# Patient Record
Sex: Female | Born: 1970 | Race: White | Hispanic: No | Marital: Married | Smoking: Never smoker
Health system: Southern US, Community
[De-identification: ages and names within clinical notes are randomized; demographics above are authoritative.]

## PROBLEM LIST (undated history)

## (undated) ENCOUNTER — Emergency Department (HOSPITAL_COMMUNITY): Admission: EM | Payer: Self-pay | Source: Home / Self Care

## (undated) DIAGNOSIS — N393 Stress incontinence (female) (male): Secondary | ICD-10-CM

## (undated) DIAGNOSIS — N816 Rectocele: Secondary | ICD-10-CM

## (undated) HISTORY — DX: Stress incontinence (female) (male): N39.3

## (undated) HISTORY — DX: Rectocele: N81.6

---

## 1990-08-05 HISTORY — PX: CHOLECYSTECTOMY: SHX55

## 2002-08-05 HISTORY — PX: TUBAL LIGATION: SHX77

## 2003-08-26 DIAGNOSIS — R635 Abnormal weight gain: Secondary | ICD-10-CM | POA: Insufficient documentation

## 2003-08-26 DIAGNOSIS — E669 Obesity, unspecified: Secondary | ICD-10-CM | POA: Insufficient documentation

## 2003-09-30 DIAGNOSIS — I4510 Unspecified right bundle-branch block: Secondary | ICD-10-CM | POA: Insufficient documentation

## 2003-09-30 DIAGNOSIS — R002 Palpitations: Secondary | ICD-10-CM | POA: Insufficient documentation

## 2006-09-17 ENCOUNTER — Ambulatory Visit (HOSPITAL_COMMUNITY): Admission: RE | Admit: 2006-09-17 | Discharge: 2006-09-17 | Payer: Self-pay | Admitting: Obstetrics & Gynecology

## 2008-06-23 ENCOUNTER — Other Ambulatory Visit: Admission: RE | Admit: 2008-06-23 | Discharge: 2008-06-23 | Payer: Self-pay | Admitting: Obstetrics and Gynecology

## 2008-08-05 HISTORY — PX: ABDOMINAL HYSTERECTOMY: SHX81

## 2008-09-27 ENCOUNTER — Ambulatory Visit (HOSPITAL_COMMUNITY): Admission: RE | Admit: 2008-09-27 | Discharge: 2008-09-27 | Payer: Self-pay | Admitting: Obstetrics and Gynecology

## 2009-03-14 ENCOUNTER — Ambulatory Visit (HOSPITAL_COMMUNITY): Admission: RE | Admit: 2009-03-14 | Discharge: 2009-03-15 | Payer: Self-pay | Admitting: Obstetrics and Gynecology

## 2009-03-14 ENCOUNTER — Encounter: Payer: Self-pay | Admitting: Obstetrics and Gynecology

## 2010-11-10 LAB — CBC
HCT: 36.8 % (ref 36.0–46.0)
Hemoglobin: 10.1 g/dL — ABNORMAL LOW (ref 12.0–15.0)
MCHC: 34.9 g/dL (ref 30.0–36.0)
MCV: 92.6 fL (ref 78.0–100.0)
Platelets: 272 10*3/uL (ref 150–400)
RBC: 3.98 MIL/uL (ref 3.87–5.11)
RDW: 14.5 % (ref 11.5–15.5)
WBC: 9.5 10*3/uL (ref 4.0–10.5)

## 2010-11-10 LAB — DIFFERENTIAL
Basophils Absolute: 0 10*3/uL (ref 0.0–0.1)
Basophils Relative: 0 % (ref 0–1)
Eosinophils Absolute: 0 10*3/uL (ref 0.0–0.7)
Lymphocytes Relative: 16 % (ref 12–46)
Monocytes Absolute: 0.6 10*3/uL (ref 0.1–1.0)
Neutrophils Relative %: 78 % — ABNORMAL HIGH (ref 43–77)

## 2010-11-10 LAB — COMPREHENSIVE METABOLIC PANEL
Albumin: 3.8 g/dL (ref 3.5–5.2)
CO2: 24 mEq/L (ref 19–32)
Chloride: 105 mEq/L (ref 96–112)
Sodium: 135 mEq/L (ref 135–145)
Total Bilirubin: 0.5 mg/dL (ref 0.3–1.2)
Total Protein: 7.4 g/dL (ref 6.0–8.3)

## 2010-11-10 LAB — TYPE AND SCREEN: Antibody Screen: NEGATIVE

## 2010-12-18 NOTE — Discharge Summary (Signed)
Christy Benton, REIMANN               ACCOUNT NO.:  [id number]   MEDICAL RECORD NO.:  [id number]          PATIENT TYPE:  OIB   LOCATION:  A304                          FACILITY:  [facility]   PHYSICIAN:  Tilda Burrow, M.D. DATE OF BIRTH:  [birth year]   DATE OF ADMISSION:  03/14/2009  DATE OF DISCHARGE:  08/11/2010LH                               DISCHARGE SUMMARY   ADMITTING DIAGNOSES:  Cervical dysplasia CIN III, severe dysmenorrhea.   DISCHARGE DIAGNOSES:  Cervical dysplasia CIN III, severe dysmenorrhea,  pathology report pending, bilateral functional ovarian cyst.   PROCEDURE:  Laparoscopically-assisted vaginal hysterectomy   DISCHARGE MEDICATIONS:  1. Vicodin 5/500, 30 tablets 1-2 q.6 h. p.r.n. pain.  2. Cipro 500 mg p.o. b.i.d. x5 days.  3. Laxative of choice 1 p.o. daily.  4. Citalopram 10 mg p.o. daily.  5. Ambien 10 mg p.o. nightly p.r.n. sleep.   HOSPITAL SUMMARY:  This 40 year old gravida 4, para 4, status post C-  section x1, vaginal delivery x3, was admitted for LAVH for CIN III of  the cervix plus severe dysmenorrhea.  The patient declined consideration  of conization and endometrial ablation.  See HPI for details.   HOSPITAL COURSE:  The patient was admitted with a hemoglobin 12.9,  hematocrit 36.8, blood type O negative with laparoscopically-assisted  vaginal hysterectomy performed with EBL 100-200 mL.  Postoperatively,  the patient did excellently, had a slight tachycardia during the first  12 hours of post procedure, was ambulatory, tolerating regular diet, by  the end of the day with minimal pain, med requirements, last IV  analgesics was the evening of surgery.  She was ambulatory with  postoperative hemoglobin 10 and hematocrit 31.  On postop day #1,  tolerating regular diet, minimally uncomfortable with bowel sounds  active and considered stable for discharge with for followup.  Due to  the unexpected change in hemoglobin, we will treat prophylactically with  oral antibiotics x5 days.  In case there was some postoperative anterior  abdominal oozing, the patient is to notify office for abdominal pain,  nausea and vomiting, temperature greater than 100.6 or worsening of  overall condition.     Tilda Burrow, M.D.  Electronically Signed    JVF/MEDQ  D:  03/15/2009  T:  03/15/2009  Job:  [id number]   cc:   [facility]

## 2010-12-18 NOTE — H&P (Signed)
NAME:  Christy Benton, Christy Benton               ACCOUNT NO.:  [id number]   MEDICAL RECORD NO.:  [id number]         PATIENT TYPE:  PAMB   LOCATION:  DAY                           FACILITY:  [facility]   PHYSICIAN:  Tilda Burrow, M.D. DATE OF BIRTH:  09/25/1970   DATE OF ADMISSION:  DATE OF DISCHARGE:  LH                              HISTORY & PHYSICAL   ADMISSION DIAGNOSIS:  1. Cervical dysplasia, cervical intraepithelial neoplasia 3.  2. Severe dysmenorrhea.   HISTORY OF PRESENT ILLNESS:  This is a 40 year old female, gravida 4,  para 4, vaginal delivery x3 with cesarean section x1, is admitted at  this time for laparoscopically-assisted vaginal hysterectomy.  Christy Benton  has been seen in the office for evaluation of abnormal Pap smear showing  high-grade abnormality, originally noted in November 2009 with CIN 3  versus carcinoma in situ.  Colposcopic biopsy showed persistent high-  grade dysplasia.  Treatment options were discussed including the option  of a cold knife conization.  Ultrasound was obtained which shows the  upper limits of normal sized uterus, 8.7 x 4.1 x 6.2 cm with  heterogeneous myometrial tissues.  No discrete masses with a normal  secretory endometrium complex which was biopsied and proven to be  benign.  The patient complains of periods every 3 weeks lasting 3-6 days  with day 1 being okay but day 2 being described as the absolute worse  heavy with clots and moderate cramps.  She is exhausted with periods for  several days.  She denies dyspareunia and pelvic ache but premenstrual  backache x3 days is debilitating.  We discussed the option of  endometrial ablation and have discussed the option of ablation versus  hysterectomy.  The patient declines ablation even after thorough review  of options and desires hysterectomy as the treatment of preference.  She  has had the procedure reviewed using Krames hysterectomy booklets and  specific booklets reviewing laparoscopically-assisted  vaginal  hysterectomy.  Procedures have been reviewed in detail.  Questions  answered.  The patient has been scheduled for hysterectomy,  laparoscopically-assisted vaginal approach, on March 14, 2009.  The  patient has a small posterior vaginal wall relaxation, but does not  desire addressing this at this time.  She desires ovarian preservation  unless specific abnormalities are found at the time of surgery, at which  time removal of an ovary if considered medically indicated would be  appropriate.   Review of systems include slight stress incontinence which has the whole  bladder but otherwise no difficulty.  Bowel movements are not a  significant problem at this time.   PAST MEDICAL HISTORY:  Benign with no specific medical complaints at  this time.   SURGICAL HISTORY:  Cholecystectomy in 1992, tubal ligation in 2004,  cesarean section in 1991 with 3 sets, vaginal birth after cesarean.   ALLERGIES:  None.   MEDICATIONS:  None present.   PHYSICAL EXAMINATION:  VITAL SIGNS:  Height 5 feet 4 inches, weight  239.4, blood pressure 110/72.  GENERAL:  Pupils equal, round, and reactive.  NECK:  Supple.  CHEST:  Clear to auscultation.  ABDOMEN:  Nontender.  Well-healed lower abdominal scar from cesarean  section.  EXTERNAL GENITALIA:  Multiparous female with moderate posterior wall  relaxation.  Cervix is multiparous.  Recent Pap smear abnormal with  biopsy-proven high-grade dysplasia.  Uterus is upper limits of normal  size, mobile, mid plane to slightly retroverted.  Adnexa is nontender  without masses and negative by recent ultrasound.   PLAN:  Laparoscopically-assisted vaginal hysterectomy with preservation  of ovaries on March 14, 2009.      Tilda Burrow, M.D.  Electronically Signed     JVF/MEDQ  D:  03/08/2009  T:  03/09/2009  Job:  [id number]   cc:   [facility].

## 2010-12-18 NOTE — Op Note (Signed)
NAME:  Christy Benton, Christy Benton               ACCOUNT NO.:  [id number]   MEDICAL RECORD NO.:  [id number]          PATIENT TYPE:  OIB   LOCATION:  A304                          FACILITY:  [facility]   PHYSICIAN:  Tilda Burrow, M.D. DATE OF BIRTH:  05/09/1971   DATE OF PROCEDURE:  03/14/2009  DATE OF DISCHARGE:                               OPERATIVE REPORT   PREOPERATIVE DIAGNOSES:  1. Cervical dysplasia, high grade.  2. Severe dysmenorrhea.   POSTOPERATIVE DIAGNOSES:  1. Cervical dysplasia, high grade.  2. Severe dysmenorrhea.  3. Bilateral simple ovarian cysts, right paratubal cyst 1 cm.   PROCEDURE:  Laparoscopically-assisted vaginal hysterectomy.   SURGEON:  Tilda Burrow, MD   ASSISTANT:  Cecile Sheerer, RN FA   ANESTHESIA:  Kathrynn Speed, CRNA   COMPLICATIONS:  None.   FINDINGS:  Diffuse pelvic relaxation, minimal bladder flap adhesions  status post C-section, small asymptomatic rectocele, not addressed at  this time, 2 small simple cysts in the ovaries, bilaterally.   INDICATIONS:  See HPI.   DETAILS OF PROCEDURE:  The patient was taken to the operating room,  prepped and draped in the usual fashion for combined abdominal and  vaginal procedure with Flowtron leg supports in place.  Time-out  conducted.  IV antibiotics preoperatively administered and standard  prepping and draping.  Foley catheter was in place.  The cervix was  grasped with single-tooth tenaculum, and uterine manipulator placed  inside the cervix with some difficulty.  The uterine endocervical canal  was quite stenotic and may have been a contributing factor to her  dysmenorrhea.   Once the manipulator was in place, we addressed the abdomen where an  infraumbilical vertical 1-cm skin incision was made as well as  transverse suprapubic and transverse right lower quadrant incision.  Veress needle was introduced through the umbilicus, pneumoperitoneum  achieved after water droplet test confirmed free flow  of fluid into the  abdominal cavity, the pneumoperitoneum achieved under 9 mmHg, and direct  insertion of the laparoscopic trocar performed without difficulty  identifying normal pelvis and no evidence of bleeding associated with  peritoneal entry.  Photo one shows the surface of the bowel upon entry.  Photo two shows the uterus, tubes, and ovaries once the patient was in  Trendelenburg position.   The right lower quadrant trocar was placed without difficulty and clear  space lateral to the inferior epigastric vessels.  The right upper  quadrant was noted to have some thin omental adhesions status post prior  gallbladder surgery.  These were considered asymptomatic and left alone.  Attention was directed to the suprapubic area and the suprapubic trocar  placed under direct visualization without difficulty.  The LigaSure 5-mm  coagulation cutting device was placed through the right lower quadrant  port, and the uterine grasping device placed through the suprapubic  port, and attention directed to the right adnexa where the elongate  right utero-ovarian ligament could be identified as well as a right  ovarian ligament.  A series of small bites using the LigaSure to clamp,  coagulate, and transect was used to mobilize the right  adnexal  structures.  This was performed halfway down the side of the uterus and  then LigaSure used to coagulate the uterine vessels going up the side of  the uterus.  On the left side, a similar process was used with easy  access to the pedicles on this side due to the mobility of the pelvis  structures.  Bladder flap was developed anteriorly by using laparoscopic  scissors to open the bladder flap, blunt dissection used to find the  vesicouterine space and elevate the bladder anteriorly and inferiorly.  The procedure was quite hemostatic and then we went below for completion  of the procedure after deflating the abdomen and removal of all  laparoscopic  instruments, leaving the laparoscopic sleeves in place with  no pressure on these sleeves.   Vaginal portion of the case was performed by placing a medium-length  weighted speculum in the posterior vagina, identifying the cervix,  grasping at the edges with Lahey thyroid tenaculum, circumscribing the  cervix from 9 o'clock to 3 o'clock after infiltrating the anterior  vesicouterine space along its inferior portions using Marcaine solution.  The vesicouterine reflection of vaginal epithelium was transected with  Bovie, elevated and bluntly dissected upward without difficulty, and the  free space identified without difficulty.  Bladder was retracted away.  Posterior colpotomy was performed easily identifying the cul-de-sac, the  uterosacral ligaments on each side were then clamped, cut, and suture  ligated and tagged using 0 chromic, Zeppelin tissue grasping device and  Mayo scissors for transection.  These pedicles were tagged for future  identification.  Lower cardinal ligaments were serially clamped, cut,  and suture ligated in small bites, and the upper cardinal ligaments  treated similarly.  When we reached the lower aspects of the broad  ligament, we were able to cross-clamp across the remaining pedicle  completely and transect the attachments, completely freeing up the  uterine specimen which could be removed.  These pedicles were ligated  with 0 chromic as well.  Pedicles were inspected, and it was quite  hemostatic except for a small bit of oozing from the posterior colpotomy  incision posterior edge of cuff.  The uterosacral ligaments were quite  lax with a potential for enterocele, so a suture of 2-0 Prolene was used  to grasp the medial aspects of the uterosacral pedicle on each side and  loosely attached them through the tissues of the posterior vaginal cuff,  approximately 5 to 8 mm above the edge of the colpotomy incision.  This  was placed in such a way to reduce lateral  retraction of the uterosacral  ligaments.  The bladder flap was then able to be brought down and  attached to the posterior aspects of the peritoneum behind the Prolene  suture and close the cuff.  Hemostasis had been quite good, and pedicles  had been inspected for hemostasis.   The cuff was then closed with a series of interrupted 0 chromic sutures  placed front in the sagittal plane, pulling cuff together nicely.  The 2-  0 Prolene had been tied down loosely and knot buried prior to the  vaginal cuff closure.  Hemostasis was excellent.  EBL 100 mL.  Sponge  and needle counts were correct.   Attention was then redirected to the abdomen where reinsufflation was  performed.  The pelvis inspected.  Photo three showing the cuff  immediately upon visualizing the pelvis, photo four showing a fuller  view of the pelvis.  The photo five  shows the right sidewall with the  ureter visible and clearly normal diameter and peristalsing without  difficulty.  Procedure was considered successful.  Abdomen was deflated.  Laparoscopic equipment having been removed and the umbilical site closed  with 0 Vicryl at the fascial level and 3-0 Vicryl at subcu level and  Steri-Strips placed.  EBL 100 mL.  Sponge and needle counts correct.      Tilda Burrow, M.D.  Electronically Signed     JVF/MEDQ  D:  03/14/2009  T:  03/14/2009  Job:  [id number]   cc:   [facility].   Wyvonnia Lora  Fax: [phone]

## 2015-05-30 ENCOUNTER — Telehealth: Payer: Self-pay | Admitting: Adult Health

## 2015-05-30 ENCOUNTER — Other Ambulatory Visit: Payer: Self-pay | Admitting: Adult Health

## 2015-05-30 NOTE — Telephone Encounter (Signed)
Number could not be completed

## 2015-05-31 ENCOUNTER — Other Ambulatory Visit: Payer: Self-pay | Admitting: Adult Health

## 2015-05-31 ENCOUNTER — Telehealth: Payer: Self-pay | Admitting: Adult Health

## 2015-05-31 DIAGNOSIS — IMO0002 Reserved for concepts with insufficient information to code with codable children: Secondary | ICD-10-CM

## 2015-05-31 DIAGNOSIS — R229 Localized swelling, mass and lump, unspecified: Principal | ICD-10-CM

## 2015-05-31 NOTE — Telephone Encounter (Signed)
Left message to call me back with details about mammogram

## 2015-05-31 NOTE — Telephone Encounter (Signed)
Pt had mammogram on mobile unit and they called and told her, she needs US on left breast, [facility] to [facility] and [facility] prior to US.

## 2015-06-07 ENCOUNTER — Ambulatory Visit (INDEPENDENT_AMBULATORY_CARE_PROVIDER_SITE_OTHER): Payer: BLUE CROSS/BLUE SHIELD | Admitting: Adult Health

## 2015-06-07 ENCOUNTER — Encounter: Payer: Self-pay | Admitting: Adult Health

## 2015-06-07 VITALS — BP 102/60 | HR 80 | Ht 64.5 in | Wt 218.5 lb

## 2015-06-07 DIAGNOSIS — Z1212 Encounter for screening for malignant neoplasm of rectum: Secondary | ICD-10-CM | POA: Diagnosis not present

## 2015-06-07 DIAGNOSIS — Z01419 Encounter for gynecological examination (general) (routine) without abnormal findings: Secondary | ICD-10-CM

## 2015-06-07 DIAGNOSIS — N816 Rectocele: Secondary | ICD-10-CM

## 2015-06-07 DIAGNOSIS — Z1389 Encounter for screening for other disorder: Secondary | ICD-10-CM

## 2015-06-07 DIAGNOSIS — N393 Stress incontinence (female) (male): Secondary | ICD-10-CM

## 2015-06-07 HISTORY — DX: Stress incontinence (female) (male): N39.3

## 2015-06-07 HISTORY — DX: Rectocele: N81.6

## 2015-06-07 LAB — POCT URINALYSIS DIPSTICK
GLUCOSE UA: NEGATIVE
Leukocytes, UA: NEGATIVE
NITRITE UA: NEGATIVE
Protein, UA: NEGATIVE
RBC UA: NEGATIVE

## 2015-06-07 LAB — HEMOCCULT GUIAC POC 1CARD (OFFICE): FECAL OCCULT BLD: NEGATIVE

## 2015-06-07 MED ORDER — OXYBUTYNIN CHLORIDE ER 10 MG PO TB24: 10.0000 mg | ORAL_TABLET | Freq: Every day | ORAL | Status: DC

## 2015-06-07 NOTE — Patient Instructions (Signed)
Labs Tuesday 11/8 Get US 11/8 Physical in  1 year Mammogram yearly Colonoscopy at 48

## 2015-06-07 NOTE — Progress Notes (Signed)
Patient ID: Christy Benton, female   DOB: 1970-12-14, 44 y.o.   MRN: [id number] History of Present Illness: Christy Benton is a 44 year old white female in for a well woman gyn exam,she is sp hysterectomy.She is complaining of bladder leaking and stress incontinence.   Current Medications, Allergies, Past Medical History, Past Surgical History, Family History and Social History were reviewed in [facility] electronic medical record.     Review of Systems: Patient denies any headaches, hearing loss, fatigue, blurred vision, shortness of breath, chest pain, abdominal pain, problems with bowel movements, or intercourse. No joint pain or mood swings.See HPI for positives.    Physical Exam:BP 102/60 mmHg  Pulse 80  Ht 5' 4.5" (1.638 m)  Wt 218 lb 8 oz (99.111 kg)  BMI 36.94 kg/m2 urine negative General:  Well developed, well nourished, no acute distress Skin:  Warm and dry Neck:  Midline trachea, normal thyroid, good ROM, no lymphadenopathy Lungs; Clear to auscultation bilaterally Breast:  No dominant palpable mass, retraction, or nipple discharge Cardiovascular: Regular rate and rhythm Abdomen:  Soft, non tender, no hepatosplenomegaly Pelvic:  External genitalia is normal in appearance, no lesions.  The vagina is normal in appearance. Urethra has no lesions or masses. The cervix and uterus are absent.  No adnexal masses or tenderness noted.Bladder is non tender, no masses felt. Rectal: Good sphincter tone, no polyps, or hemorrhoids felt.  Hemoccult negative.+rectocele Extremities/musculoskeletal:  No swelling or varicosities noted, no clubbing or cyanosis Psych:  No mood changes, alert and cooperative,seems happy   Impression: Well woman gyn exam no pap SUI Rectocele     Plan: Rx oxybutynin ER 10 mg 1 daily #30 with 6 refills Physical in 1 year Mammogram yearly Has breast US next week for abnormal mammogram at mobile mammo. Check CBC,CMP,TSH and lipids fasting next week

## 2015-06-13 ENCOUNTER — Other Ambulatory Visit (HOSPITAL_COMMUNITY): Payer: Self-pay

## 2015-06-13 ENCOUNTER — Ambulatory Visit (HOSPITAL_COMMUNITY)
Admission: RE | Admit: 2015-06-13 | Discharge: 2015-06-13 | Disposition: A | Discharge status: Home / Self Care | Payer: BLUE CROSS/BLUE SHIELD | Source: Ambulatory Visit | Attending: Adult Health | Admitting: Adult Health

## 2015-06-13 DIAGNOSIS — N6002 Solitary cyst of left breast: Secondary | ICD-10-CM | POA: Diagnosis not present

## 2015-06-13 DIAGNOSIS — R229 Localized swelling, mass and lump, unspecified: Secondary | ICD-10-CM

## 2015-06-13 DIAGNOSIS — R928 Other abnormal and inconclusive findings on diagnostic imaging of breast: Secondary | ICD-10-CM | POA: Diagnosis present

## 2015-06-13 DIAGNOSIS — IMO0002 Reserved for concepts with insufficient information to code with codable children: Secondary | ICD-10-CM

## 2015-06-14 ENCOUNTER — Telehealth: Payer: Self-pay | Admitting: Adult Health

## 2015-06-14 LAB — CBC
HEMATOCRIT: 37.4 % (ref 34.0–46.6)
Hemoglobin: 12.8 g/dL (ref 11.1–15.9)
MCH: 31.3 pg (ref 26.6–33.0)
MCHC: 34.2 g/dL (ref 31.5–35.7)
MCV: 91 fL (ref 79–97)
Platelets: 303 10*3/uL (ref 150–379)
RBC: 4.09 x10E6/uL (ref 3.77–5.28)
RDW: 13.1 % (ref 12.3–15.4)
WBC: 6.8 10*3/uL (ref 3.4–10.8)

## 2015-06-14 LAB — COMPREHENSIVE METABOLIC PANEL
A/G RATIO: 1.4 (ref 1.1–2.5)
ALBUMIN: 4.2 g/dL (ref 3.5–5.5)
ALT: 13 IU/L (ref 0–32)
AST: 15 IU/L (ref 0–40)
Alkaline Phosphatase: 72 IU/L (ref 39–117)
BUN / CREAT RATIO: 12 (ref 9–23)
BUN: 9 mg/dL (ref 6–24)
Bilirubin Total: 0.4 mg/dL (ref 0.0–1.2)
CALCIUM: 9.2 mg/dL (ref 8.7–10.2)
CO2: 23 mmol/L (ref 18–29)
Chloride: 102 mmol/L (ref 97–106)
Creatinine, Ser: 0.73 mg/dL (ref 0.57–1.00)
GFR, EST AFRICAN AMERICAN: 114 mL/min/{1.73_m2} (ref >59)
GFR, EST NON AFRICAN AMERICAN: 99 mL/min/{1.73_m2} (ref >59)
GLOBULIN, TOTAL: 2.9 g/dL (ref 1.5–4.5)
Glucose: 95 mg/dL (ref 65–99)
POTASSIUM: 4.2 mmol/L (ref 3.5–5.2)
SODIUM: 139 mmol/L (ref 136–144)
TOTAL PROTEIN: 7.1 g/dL (ref 6.0–8.5)

## 2015-06-14 LAB — LIPID PANEL
CHOL/HDL RATIO: 2.7 ratio (ref 0.0–4.4)
Cholesterol, Total: 164 mg/dL (ref 100–199)
HDL: 61 mg/dL (ref >39)
LDL Calculated: 88 mg/dL (ref 0–99)
Triglycerides: 74 mg/dL (ref 0–149)
VLDL Cholesterol Cal: 15 mg/dL (ref 5–40)

## 2015-06-14 LAB — TSH: TSH: 1.4 u[IU]/mL (ref 0.450–4.500)

## 2015-06-14 NOTE — Telephone Encounter (Signed)
Pt aware labs excellent 

## 2015-07-21 ENCOUNTER — Encounter: Payer: Self-pay | Admitting: Adult Health

## 2016-02-29 IMAGING — US US BREAST LTD UNI LEFT INC AXILLA
1 series · 5 of 5 positions shown · non-contrast
Comparison: 05/22/2015 and 11/01/2013

CLINICAL DATA: Possible mass left breast identified on recent
outside screening mammogram performed at a mobile unit. The patient
was recalled for further evaluation with ultrasound of the left
breast. The patient does not palpate a mass.

EXAM:
ULTRASOUND OF THE LEFT BREAST

[Series 1: us breast ltd uni left inc axilla · 0.07mm/px · 5 of 5 slices shown]
[im 1/5]
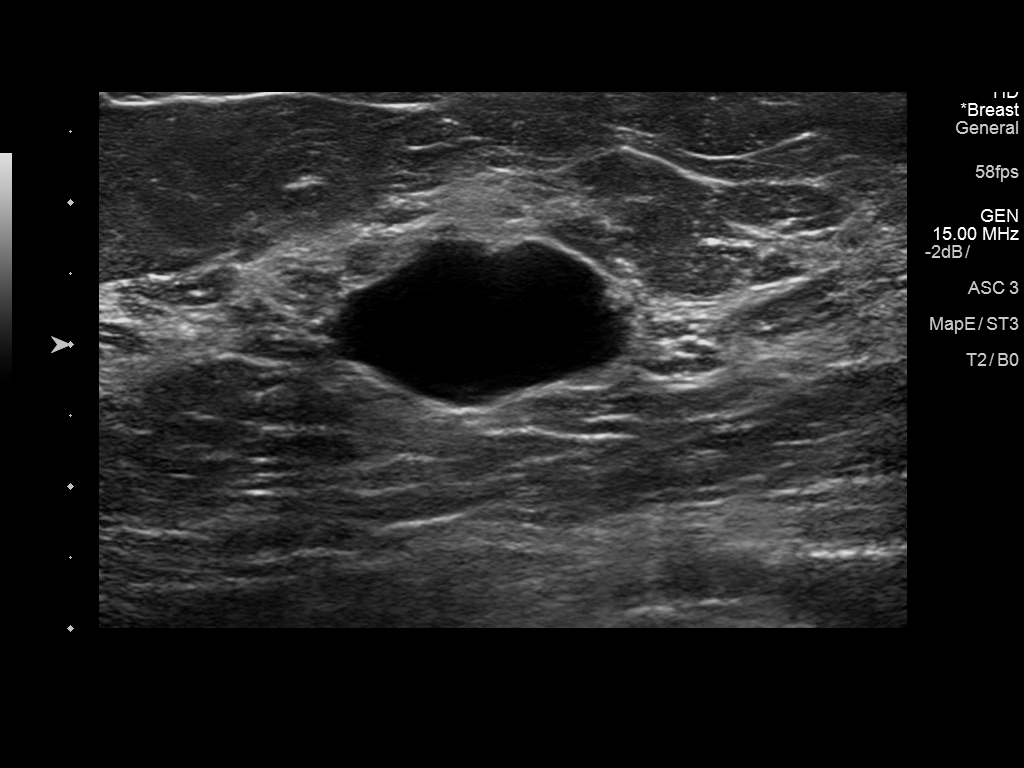
[im 2/5]
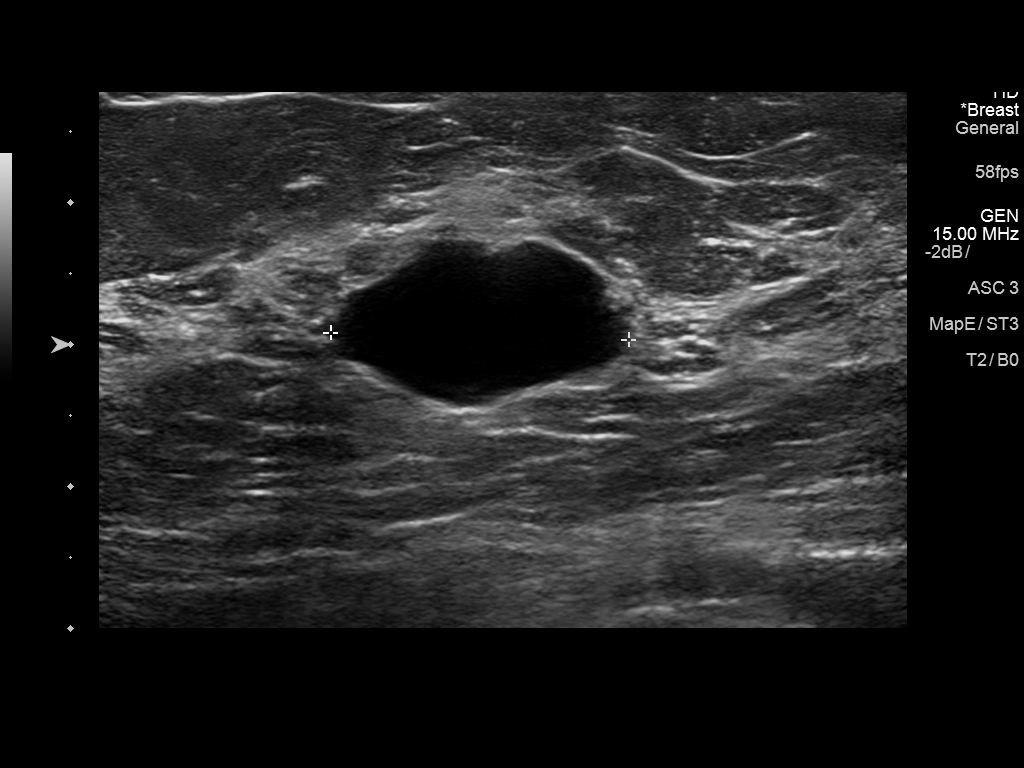
[im 3/5]
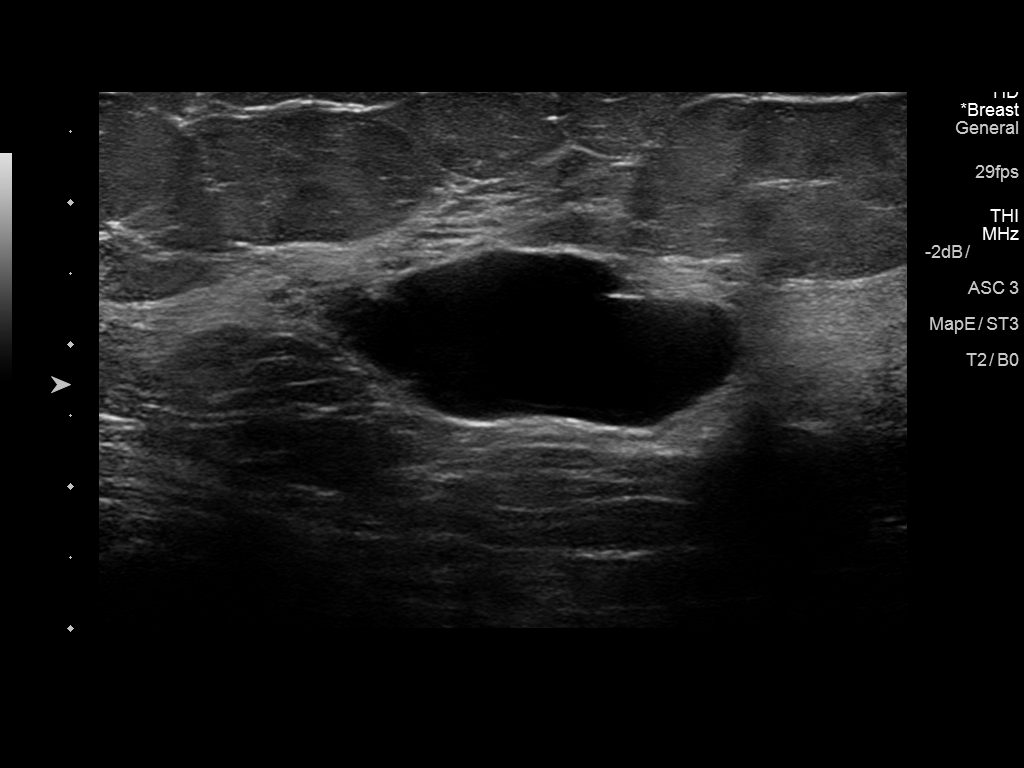
[im 4/5]
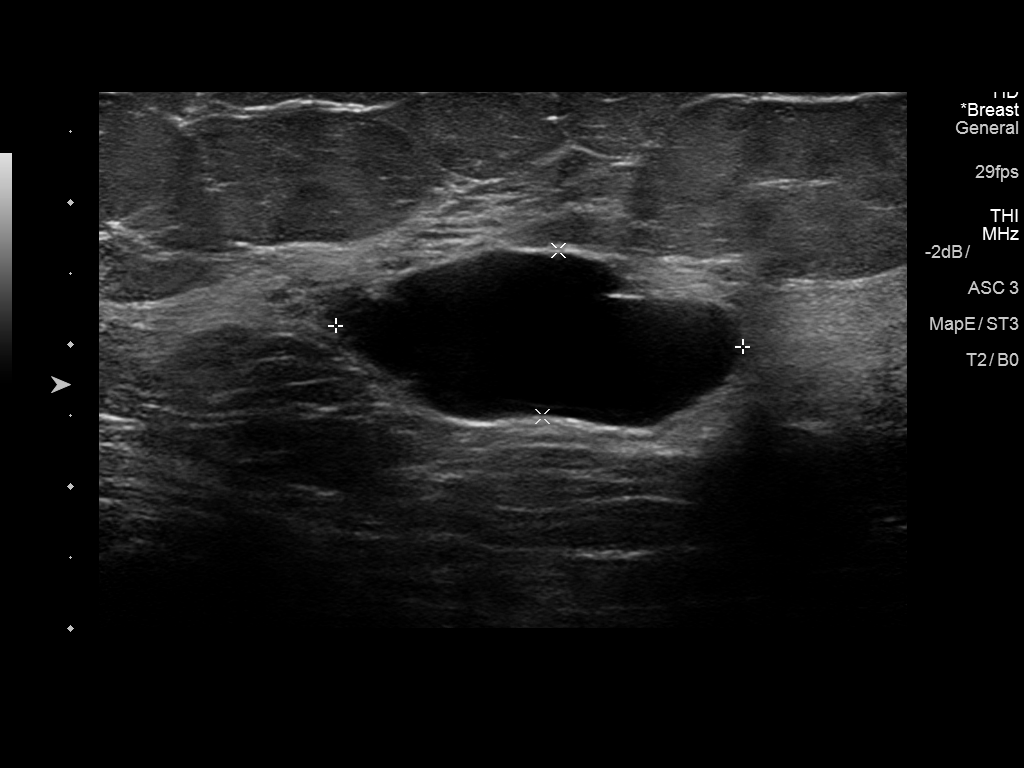
[im 5/5]
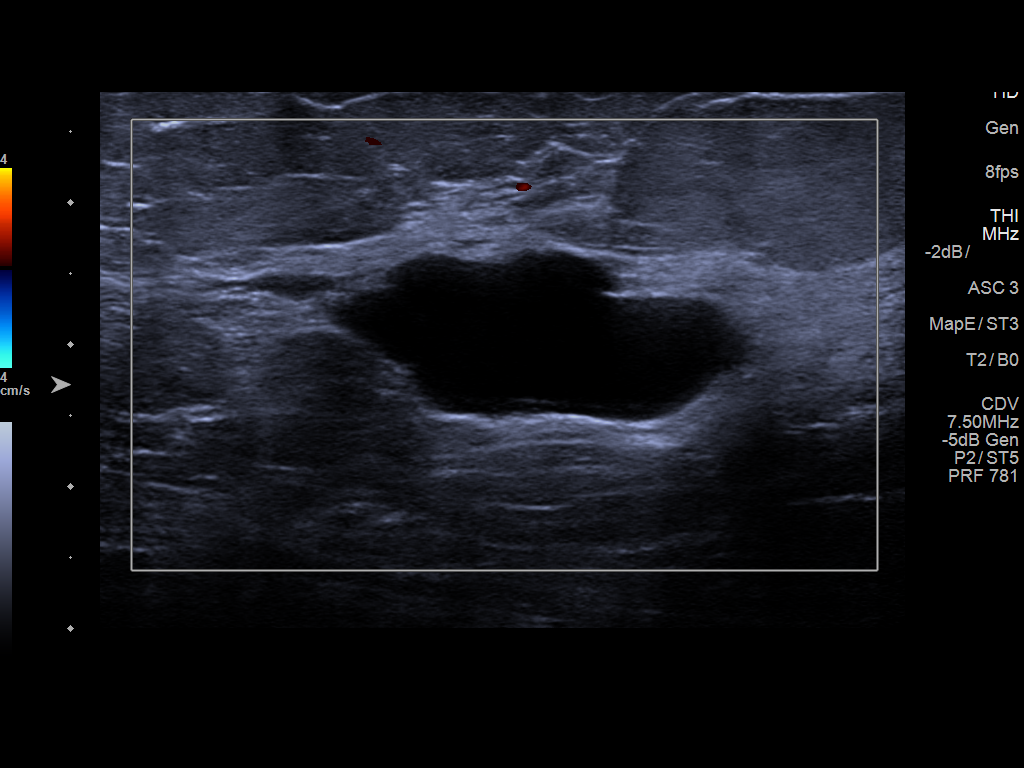

[5 of 5 positions shown; findings below may reference images not displayed]

FINDINGS: On physical exam, no mass is palpated in the upper outer left
breast.

Targeted ultrasound is performed, showing an oval simple cyst at 2
o'clock position 4 cm from the nipple measuring 2.9 x 1.2 x 2.1 cm.
This corresponds in expected size, shape, and location to the
mammographically detected mass identified in the upper outer
quadrant on the recent screening mammogram dated 05/22/2015.
IMPRESSION: No evidence of malignancy in the left breast. 2.9 cm simple cyst at
2 o'clock position left breast accounts for the mammographically
detected mass.

RECOMMENDATION:
Screening mammogram in one year.(Code:UV-[id number])

I have discussed the findings and recommendations with the patient.
Results were also provided in writing at the conclusion of the
visit. If applicable, a reminder letter will be sent to the patient
regarding the next appointment.

BI-RADS CATEGORY  2: Benign.

## 2016-10-04 DIAGNOSIS — N200 Calculus of kidney: Secondary | ICD-10-CM | POA: Insufficient documentation

## 2017-12-16 ENCOUNTER — Encounter: Payer: Self-pay | Admitting: Cardiology

## 2017-12-16 ENCOUNTER — Ambulatory Visit: Payer: BLUE CROSS/BLUE SHIELD | Admitting: Cardiology

## 2017-12-16 VITALS — BP 120/68 | HR 81 | Ht 64.0 in | Wt 208.0 lb

## 2017-12-16 DIAGNOSIS — R002 Palpitations: Secondary | ICD-10-CM

## 2017-12-16 DIAGNOSIS — R079 Chest pain, unspecified: Secondary | ICD-10-CM | POA: Diagnosis not present

## 2017-12-16 NOTE — Progress Notes (Signed)
Clinical Summary Christy Benton is a 47 y.o.female seen as new consult for chest pain, referred by Dr Michel Santee for ches tpain.   1. Chest pain - started end of December. Recently increased in Feb - mid to left chest, heaviness feeling. Can have SOB. Mainly occurs with rest. Not positional. Can last several hours constant - highest level of activity is gardening/yard work, can have some SOB with walking - no recent edema    2. Palpitations  chronic symptoms, overall mild and infrequent.  - No sodas, no tea, one coffee   Past Medical History:  Diagnosis Date  . Rectocele 06/07/2015  . Stress incontinence in female 06/07/2015     No Known Allergies   Current Outpatient Medications  Medication Sig Dispense Refill  . oxybutynin (DITROPAN-XL) 10 MG 24 hr tablet Take 1 tablet (10 mg total) by mouth at bedtime. 30 tablet 6   No current facility-administered medications for this visit.      Past Surgical History:  Procedure Laterality Date  . ABDOMINAL HYSTERECTOMY  2010  . CHOLECYSTECTOMY  1992  . TUBAL LIGATION  2004     No Known Allergies    Family History  Problem Relation Age of Onset  . Hypertension Mother   . Arrhythmia Father   . Arrhythmia Maternal Grandmother        had pacemaker  . Stroke Maternal Grandfather        mini strokes  . Alzheimer's disease Paternal Grandmother   . Other Paternal Grandfather        due to MVA     Social History Christy Benton reports that she has never smoked. She has never used smokeless tobacco. Christy Benton reports that she drinks alcohol.   Review of Systems CONSTITUTIONAL: No weight loss, fever, chills, weakness or fatigue.  HEENT: Eyes: No visual loss, blurred vision, double vision or yellow sclerae.No hearing loss, sneezing, congestion, runny nose or sore throat.  SKIN: No rash or itching.  CARDIOVASCULAR: per hpi RESPIRATORY: per hpi GASTROINTESTINAL: No anorexia, nausea, vomiting or diarrhea. No abdominal pain or  blood.  GENITOURINARY: No burning on urination, no polyuria NEUROLOGICAL: No headache, dizziness, syncope, paralysis, ataxia, numbness or tingling in the extremities. No change in bowel or bladder control.  MUSCULOSKELETAL: No muscle, back pain, joint pain or stiffness.  LYMPHATICS: No enlarged nodes. No history of splenectomy.  PSYCHIATRIC: No history of depression or anxiety.  ENDOCRINOLOGIC: No reports of sweating, cold or heat intolerance. No polyuria or polydipsia.  Marland Kitchen   Physical Examination Vitals:   12/16/17 1345 12/16/17 1347  BP: 124/70 120/68  Pulse: 81   SpO2: 98%    Vitals:   12/16/17 1345  Weight: 208 lb (94.3 kg)  Height:  (1.626 m)    Gen: resting comfortably, no acute distress HEENT: no scleral icterus, pupils equal round and reactive, no palptable cervical adenopathy,  CV: RRR, no m/r/g, no jvd Resp: Clear to auscultation bilaterally GI: abdomen is soft, non-tender, non-distended, normal bowel sounds, no hepatosplenomegaly MSK: extremities are warm, no edema.  Skin: warm, no rash Neuro:  no focal deficits Psych: appropriate affect   Diagnostic Studies     Assessment and Plan  1. Chest pain - EKG from pcp personally reviewed, NSR without ischemic changes.  - we will plan a GXT to further evaluate for possible ischemic etiology of her symptoms.   2. Palpitations - chronic, overally mild and infrquent - continue to monitor at this time.  Arnoldo Lenis, M.D.

## 2017-12-16 NOTE — Patient Instructions (Signed)
Your physician recommends that you schedule a follow-up appointment in: to be determined after testing    Your physician has requested that you have an exercise tolerance test. For further information please visit [email or web address] Please also follow instruction sheet, as given.    Your physician recommends that you continue on your current medications as directed. Please refer to the Current Medication list given to you today.      No lab work  Ordered today    Thank you for choosing [facility] !

## 2017-12-21 ENCOUNTER — Encounter: Payer: Self-pay | Admitting: Cardiology

## 2017-12-24 ENCOUNTER — Encounter (HOSPITAL_COMMUNITY): Payer: BLUE CROSS/BLUE SHIELD

## 2017-12-31 ENCOUNTER — Ambulatory Visit (HOSPITAL_COMMUNITY)
Admission: RE | Admit: 2017-12-31 | Discharge: 2017-12-31 | Disposition: A | Discharge status: Home / Self Care | Payer: BLUE CROSS/BLUE SHIELD | Source: Ambulatory Visit | Attending: Cardiology | Admitting: Cardiology

## 2017-12-31 DIAGNOSIS — R079 Chest pain, unspecified: Secondary | ICD-10-CM | POA: Insufficient documentation

## 2017-12-31 LAB — EXERCISE TOLERANCE TEST
CSEPEDS: 3 s
CSEPEW: 10.1 METS
CSEPPHR: 193 {beats}/min
Exercise duration (min): 7 min
MPHR: 171 {beats}/min
Percent HR: 112 %
RPE: 15
Rest HR: 70 {beats}/min

## 2018-01-13 ENCOUNTER — Other Ambulatory Visit: Payer: Self-pay

## 2018-01-13 ENCOUNTER — Encounter: Payer: Self-pay | Admitting: Adult Health

## 2018-01-13 ENCOUNTER — Ambulatory Visit (INDEPENDENT_AMBULATORY_CARE_PROVIDER_SITE_OTHER): Payer: BLUE CROSS/BLUE SHIELD | Admitting: Adult Health

## 2018-01-13 VITALS — BP 117/78 | HR 78 | Resp 16 | Ht 64.0 in | Wt 202.0 lb

## 2018-01-13 DIAGNOSIS — Z01419 Encounter for gynecological examination (general) (routine) without abnormal findings: Secondary | ICD-10-CM

## 2018-01-13 NOTE — Progress Notes (Signed)
Patient ID: Christy Benton, female   DOB: 1971-02-17, 47 y.o.   MRN: [id number] History of Present Illness:  Christy Benton is a 47 year old white female, married, sp hysterectomy in for well woman exam.She has lost 25 lbs since March. PCP is Dr Michel SanteeFavero.  Current Medications, Allergies, Past Medical History, Past Surgical History, Family History and Social History were reviewed in [facility] Link electronic medical record.     Review of Systems:  Patient denies any headaches, hearing loss, fatigue, blurred vision,  chest pain, abdominal pain, problems with bowel movements, urination, or intercourse. No joint pain or mood swings. Has ?shortness of breath at times, or just heavy feeling in chest,has seen Dr Wyline MoodBranch and had negative work up so far.   Physical Exam:BP 117/78 (BP Location: Right Arm, Patient Position: Sitting, Cuff Size: Normal)   Pulse 78   Resp 16   Ht 5\' 4"  (1.626 m)   Wt 202 lb (91.6 kg)   BMI 34.67 kg/m  General:  Well developed, well nourished, no acute distress Skin:  Warm and dry Neck:  Midline trachea, normal thyroid, good ROM, no lymphadenopathy Lungs; Clear to auscultation bilaterally Breast:  No dominant palpable mass, retraction, or nipple discharge Cardiovascular: Regular rate and rhythm Abdomen:  Soft, non tender, no hepatosplenomegaly Pelvic:  Deferred at her request, no problems  Rectal: Deferred at her request, will send 3 hemoccult cards home to do Extremities/musculoskeletal:  No swelling or varicosities noted, no clubbing or cyanosis Psych:  No mood changes, alert and cooperative,seems happy PHQ 2 score 0.  Impression: 1. Encounter for well woman exam       Plan: Given 3 hemoccult cards to take home Labs per PCP Get mammogram now  Physical in 1 year Discussed cologaurd vs colonoscopy

## 2019-06-16 ENCOUNTER — Other Ambulatory Visit: Payer: Self-pay

## 2019-06-16 ENCOUNTER — Encounter: Payer: Self-pay | Admitting: Adult Health

## 2019-06-16 ENCOUNTER — Telehealth (INDEPENDENT_AMBULATORY_CARE_PROVIDER_SITE_OTHER): Payer: BC Managed Care – PPO | Admitting: Adult Health

## 2019-06-16 DIAGNOSIS — G479 Sleep disorder, unspecified: Secondary | ICD-10-CM | POA: Insufficient documentation

## 2019-06-16 DIAGNOSIS — R232 Flushing: Secondary | ICD-10-CM | POA: Diagnosis not present

## 2019-06-16 DIAGNOSIS — F419 Anxiety disorder, unspecified: Secondary | ICD-10-CM

## 2019-06-16 DIAGNOSIS — F439 Reaction to severe stress, unspecified: Secondary | ICD-10-CM

## 2019-06-16 MED ORDER — CITALOPRAM HYDROBROMIDE 20 MG PO TABS: 20.0000 mg | ORAL_TABLET | Freq: Every day | ORAL | 3 refills | Status: DC

## 2019-06-16 NOTE — Progress Notes (Signed)
Patient ID: Christy Benton, female   DOB: 05-Mar-1971, 48 y.o.   MRN: [id number]   TELEHEALTH VIRTUAL GYNECOLOGY VISIT ENCOUNTER NOTE  I connected with Christy Benton on 06/16/19 at 10:50 AM EST by telephone at home and verified that I am speaking with the correct person using two identifiers.   I discussed the limitations, risks, security and privacy concerns of performing an evaluation and management service by telephone and the availability of in person appointments. I also discussed with the patient that there may be a patient responsible charge related to this service. The patient expressed understanding and agreed to proceed.   History:  Christy Benton is a 48 y.o. G48P4 female being evaluated today for ?menpause, she is having anxiety, hot flashes and not sleeping well, has stress at home, and feels stress of the world. Feels on edge.She took celexa years ago. Discussed estrogen vs SSRI and she wants to try celexa again.  She denies any pelvic pain or other concerns.       Past Medical History:  Diagnosis Date  . Rectocele 06/07/2015  . Stress incontinence in female 06/07/2015   Past Surgical History:  Procedure Laterality Date  . ABDOMINAL HYSTERECTOMY  2010  . CHOLECYSTECTOMY  1992  . TUBAL LIGATION  2004   The following portions of the patient's history were reviewed and updated as appropriate: allergies, current medications, past family history, past medical history, past social history, past surgical history and problem list.   Health Maintenance:  Sp hysterectomy, no mammogram on file.  Review of Systems:  Pertinent items noted in HPI and remainder of comprehensive ROS otherwise negative.  Physical Exam:   General:  Alert, oriented and cooperative.   Mental Status: Normal mood and affect perceived. Normal judgment and thought content.  Physical exam deferred due to nature of the encounter  Labs and Imaging No results found for this or any previous visit (from the  past 336 hour(s)). No results found.    Assessment and Plan:     1. Anxiety Will Rx celexa Meds ordered this encounter  Medications  . citalopram (CELEXA) 20 MG tablet    Sig: Take 1 tablet (20 mg total) by mouth daily.    Dispense:  30 tablet    Refill:  3    Order Specific Question:   Supervising Provider    Answer:   Florian Buff [[id number]]   Will follow up in 8 weeks with phone visit, or sooner if needed  2. Stress at home  3. Hot flashes  4. Sleep disturbance Ok to take melatonin        I discussed the assessment and treatment plan with the patient. The patient was provided an opportunity to ask questions and all were answered. The patient agreed with the plan and demonstrated an understanding of the instructions.   The patient was advised to call back or seek an in-person evaluation/go to the ED if the symptoms worsen or if the condition fails to improve as anticipated.  I provided 7 minutes of non-face-to-face time during this encounter.   Derrek Monaco, NP Center for [employer], [place]

## 2019-08-11 ENCOUNTER — Encounter: Payer: Self-pay | Admitting: Adult Health

## 2019-08-11 ENCOUNTER — Other Ambulatory Visit: Payer: Self-pay

## 2019-08-11 ENCOUNTER — Telehealth (INDEPENDENT_AMBULATORY_CARE_PROVIDER_SITE_OTHER): Payer: BC Managed Care – PPO | Admitting: Adult Health

## 2019-08-11 DIAGNOSIS — F419 Anxiety disorder, unspecified: Secondary | ICD-10-CM | POA: Diagnosis not present

## 2019-08-11 MED ORDER — CITALOPRAM HYDROBROMIDE 20 MG PO TABS: 20.0000 mg | ORAL_TABLET | Freq: Every day | ORAL | 6 refills | Status: DC

## 2019-08-11 NOTE — Progress Notes (Addendum)
Patient ID: Christy Benton, female   DOB: 1970-12-27, 49 y.o.   MRN: [id number]   TELEHEALTH VIRTUAL GYNECOLOGY VISIT ENCOUNTER NOTE  I connected with Christy Benton on 08/11/19 at 11:50 AM EST by telephone at home and verified that I am speaking with the correct person using two identifiers.   I discussed the limitations, risks, security and privacy concerns of performing an evaluation and management service by telephone and the availability of in person appointments. I also discussed with the patient that there may be a patient responsible charge related to this service. The patient expressed understanding and agreed to proceed.   History:  Christy Benton is a 49 y.o. G81P4 female being evaluated today for follow up on anxiety,hot flashes and not sleeping well, and she says she is much better, the Celexa is working.. She has gained some, no other concerns.       Past Medical History:  Diagnosis Date  . Rectocele 06/07/2015  . Stress incontinence in female 06/07/2015   Past Surgical History:  Procedure Laterality Date  . ABDOMINAL HYSTERECTOMY  2010  . CHOLECYSTECTOMY  1992  . TUBAL LIGATION  2004   The following portions of the patient's history were reviewed and updated as appropriate: allergies, current medications, past family history, past medical history, past social history, past surgical history and problem list.   Health Maintenance:  Sp hysterectomy, no mammogram in system  Review of Systems:  Pertinent items noted in HPI and remainder of comprehensive ROS otherwise negative.  Physical Exam:   General:  Alert, oriented and cooperative.   Mental Status: Normal mood and affect perceived. Normal judgment and thought content.  Physical exam deferred due to nature of the encounter Fall risk is moderate. PHQ 2 score is 0. Discussed counting calories to 1400 per day, increase walking, decrease bad carbs and keep food log.   Labs and Imaging No results found for this or any  previous visit (from the past 336 hour(s)). No results found.    Assessment and Plan:     1. Anxiety Will refill celexa Meds ordered this encounter  Medications  . citalopram (CELEXA) 20 MG tablet    Sig: Take 1 tablet (20 mg total) by mouth daily.    Dispense:  30 tablet    Refill:  6    Order Specific Question:   Supervising Provider    Answer:   Duane Lope H [[id number]]  Follow up in 6 months or sooner if needed       I discussed the assessment and treatment plan with the patient. The patient was provided an opportunity to ask questions and all were answered. The patient agreed with the plan and demonstrated an understanding of the instructions.   The patient was advised to call back or seek an in-person evaluation/go to the ED if the symptoms worsen or if the condition fails to improve as anticipated.  I provided  10  minutes of non-face-to-face time during this encounter.   Cyril Mourning, NP Center for [employer], [facility]

## 2019-09-24 ENCOUNTER — Other Ambulatory Visit: Payer: Self-pay | Admitting: Adult Health

## 2019-09-24 MED ORDER — CITALOPRAM HYDROBROMIDE 20 MG PO TABS: 20.0000 mg | ORAL_TABLET | Freq: Every day | ORAL | 2 refills | Status: DC

## 2019-09-24 NOTE — Progress Notes (Signed)
rx celexa for 90 days to [facility] in [place]

## 2020-01-17 ENCOUNTER — Emergency Department (HOSPITAL_COMMUNITY): Payer: No Typology Code available for payment source

## 2020-01-17 ENCOUNTER — Telehealth: Payer: Self-pay | Admitting: Orthopedic Surgery

## 2020-01-17 ENCOUNTER — Encounter (HOSPITAL_COMMUNITY): Payer: Self-pay

## 2020-01-17 ENCOUNTER — Emergency Department (HOSPITAL_COMMUNITY)
Admission: EM | Admit: 2020-01-17 | Discharge: 2020-01-17 | Disposition: A | Discharge status: Home / Self Care | Payer: No Typology Code available for payment source | Attending: Emergency Medicine | Admitting: Emergency Medicine

## 2020-01-17 ENCOUNTER — Other Ambulatory Visit: Payer: Self-pay

## 2020-01-17 DIAGNOSIS — Z23 Encounter for immunization: Secondary | ICD-10-CM | POA: Diagnosis not present

## 2020-01-17 DIAGNOSIS — Y929 Unspecified place or not applicable: Secondary | ICD-10-CM | POA: Diagnosis not present

## 2020-01-17 DIAGNOSIS — S68625A Partial traumatic transphalangeal amputation of left ring finger, initial encounter: Secondary | ICD-10-CM | POA: Insufficient documentation

## 2020-01-17 DIAGNOSIS — Y939 Activity, unspecified: Secondary | ICD-10-CM | POA: Diagnosis not present

## 2020-01-17 DIAGNOSIS — W230XXA Caught, crushed, jammed, or pinched between moving objects, initial encounter: Secondary | ICD-10-CM | POA: Diagnosis not present

## 2020-01-17 DIAGNOSIS — Y990 Civilian activity done for income or pay: Secondary | ICD-10-CM | POA: Insufficient documentation

## 2020-01-17 DIAGNOSIS — S68119A Complete traumatic metacarpophalangeal amputation of unspecified finger, initial encounter: Secondary | ICD-10-CM

## 2020-01-17 DIAGNOSIS — S60945A Unspecified superficial injury of left ring finger, initial encounter: Secondary | ICD-10-CM | POA: Diagnosis present

## 2020-01-17 HISTORY — PX: FINGER AMPUTATION: SHX636

## 2020-01-17 LAB — PROTIME-INR
INR: 1 (ref 0.8–1.2)
Prothrombin Time: 13.1 seconds (ref 11.4–15.2)

## 2020-01-17 LAB — RAPID URINE DRUG SCREEN, HOSP PERFORMED
Amphetamines: NOT DETECTED
Barbiturates: NOT DETECTED
Benzodiazepines: NOT DETECTED
Cocaine: NOT DETECTED
Opiates: NOT DETECTED
Tetrahydrocannabinol: NOT DETECTED

## 2020-01-17 LAB — BASIC METABOLIC PANEL
Anion gap: 9 (ref 5–15)
BUN: 10 mg/dL (ref 6–20)
CO2: 23 mmol/L (ref 22–32)
Calcium: 9 mg/dL (ref 8.9–10.3)
Chloride: 102 mmol/L (ref 98–111)
Creatinine, Ser: 0.78 mg/dL (ref 0.44–1.00)
GFR calc Af Amer: >60 mL/min (ref >60)
GFR calc non Af Amer: >60 mL/min (ref >60)
Glucose, Bld: 155 mg/dL — ABNORMAL HIGH (ref 70–99)
Potassium: 3.4 mmol/L — ABNORMAL LOW (ref 3.5–5.1)
Sodium: 134 mmol/L — ABNORMAL LOW (ref 135–145)

## 2020-01-17 LAB — CBC WITH DIFFERENTIAL/PLATELET
Abs Immature Granulocytes: 0.03 10*3/uL (ref 0.00–0.07)
Basophils Absolute: 0.1 10*3/uL (ref 0.0–0.1)
Basophils Relative: 1 %
Eosinophils Absolute: 0.1 10*3/uL (ref 0.0–0.5)
Eosinophils Relative: 1 %
HCT: 37.5 % (ref 36.0–46.0)
Hemoglobin: 12.2 g/dL (ref 12.0–15.0)
Immature Granulocytes: 0 %
Lymphocytes Relative: 24 %
Lymphs Abs: 2.2 10*3/uL (ref 0.7–4.0)
MCH: 31.5 pg (ref 26.0–34.0)
MCHC: 32.5 g/dL (ref 30.0–36.0)
MCV: 96.9 fL (ref 80.0–100.0)
Monocytes Absolute: 0.6 10*3/uL (ref 0.1–1.0)
Monocytes Relative: 6 %
Neutro Abs: 6.4 10*3/uL (ref 1.7–7.7)
Neutrophils Relative %: 68 %
Platelets: 324 10*3/uL (ref 150–400)
RBC: 3.87 MIL/uL (ref 3.87–5.11)
RDW: 13.2 % (ref 11.5–15.5)
WBC: 9.4 10*3/uL (ref 4.0–10.5)
nRBC: 0 % (ref 0.0–0.2)

## 2020-01-17 MED ORDER — DOXYCYCLINE HYCLATE 100 MG PO CAPS
100.0000 mg | ORAL_CAPSULE | Freq: Two times a day (BID) | ORAL | 0 refills | Status: DC
Start: 2020-01-17 — End: 2020-06-27

## 2020-01-17 MED ORDER — TETANUS-DIPHTH-ACELL PERTUSSIS 5-2.5-18.5 LF-MCG/0.5 IM SUSP
0.5000 mL | Freq: Once | INTRAMUSCULAR | Status: AC
  Administered 2020-01-17: 0.5 mL via INTRAMUSCULAR
  Filled 2020-01-17: qty 0.5

## 2020-01-17 MED ORDER — LIDOCAINE-EPINEPHRINE (PF) 2 %-1:200000 IJ SOLN
INTRAMUSCULAR | Status: AC
  Administered 2020-01-17: 20 mL
  Filled 2020-01-17: qty 20

## 2020-01-17 MED ORDER — CEPHALEXIN 500 MG PO CAPS
500.0000 mg | ORAL_CAPSULE | Freq: Four times a day (QID) | ORAL | 0 refills | Status: AC
Start: 2020-01-17 — End: 2020-01-24

## 2020-01-17 MED ORDER — FENTANYL CITRATE (PF) 100 MCG/2ML IJ SOLN
50.0000 ug | Freq: Once | INTRAMUSCULAR | Status: AC
  Administered 2020-01-17: 50 ug via INTRAVENOUS
  Filled 2020-01-17: qty 2

## 2020-01-17 MED ORDER — LIDOCAINE-EPINEPHRINE (PF) 2 %-1:200000 IJ SOLN: 20.0000 mL | Freq: Once | INTRAMUSCULAR | Status: AC

## 2020-01-17 MED ORDER — CEFAZOLIN SODIUM 1 G IJ SOLR
2.0000 g | Freq: Once | INTRAMUSCULAR | Status: AC
  Administered 2020-01-17: 2 g via INTRAMUSCULAR
  Filled 2020-01-17: qty 20

## 2020-01-17 MED ORDER — POVIDONE-IODINE 10 % EX SOLN
CUTANEOUS | Status: AC
  Administered 2020-01-17: 1
  Filled 2020-01-17: qty 30

## 2020-01-17 MED ORDER — STERILE WATER FOR INJECTION IJ SOLN
INTRAMUSCULAR | Status: AC
  Administered 2020-01-17: 10 mL
  Filled 2020-01-17: qty 10

## 2020-01-17 MED ORDER — HYDROCODONE-ACETAMINOPHEN 5-325 MG PO TABS: 2.0000 | ORAL_TABLET | ORAL | 0 refills | Status: DC | PRN

## 2020-01-17 NOTE — Discharge Instructions (Signed)
We signed the ER for the finger injury. Please follow-up with Dr. Romeo Apple for optimal wound care and further management.  Take the antibiotics prescribed and keep the wound clean and dry.

## 2020-01-17 NOTE — Telephone Encounter (Signed)
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 Said happened during work - no worker's comp information as of yet however due to urgent nature of injury, assume okay to schedule until further information available.

## 2020-01-17 NOTE — ED Provider Notes (Signed)
[facility] EMERGENCY DEPARTMENT Provider Note   CSN: [id number] Arrival date & time: 01/17/20  1250     History Chief Complaint  Patient presents with  . Hand Pain    Christy Benton is a 49 y.o. female.  HPI     Past Medical History:  Diagnosis Date  . Rectocele 06/07/2015  . Stress incontinence in female 06/07/2015    Patient Active Problem List   Diagnosis Date Noted  . Sleep disturbance 06/16/2019  . Hot flashes 06/16/2019  . Stress at home 06/16/2019  . Anxiety 06/16/2019  . Encounter for well woman exam 01/13/2018  . Stress incontinence in female 06/07/2015  . Rectocele 06/07/2015    Past Surgical History:  Procedure Laterality Date  . ABDOMINAL HYSTERECTOMY  2010  . CHOLECYSTECTOMY  1992  . TUBAL LIGATION  2004     OB History    Gravida  4   Para  4   Term      Preterm      AB      Living  4     SAB      TAB      Ectopic      Multiple      Live Births              Family History  Problem Relation Age of Onset  . Hypertension Mother   . Arrhythmia Father   . Arrhythmia Maternal Grandmother        had pacemaker  . Stroke Maternal Grandfather        mini strokes  . Alzheimer's disease Paternal Grandmother   . Other Paternal Grandfather        due to MVA    Social History   Tobacco Use  . Smoking status: Never Smoker  . Smokeless tobacco: Never Used  Vaping Use  . Vaping Use: Never used  Substance Use Topics  . Alcohol use: Yes    Comment: rarely  . Drug use: No    Home Medications Prior to Admission medications   Medication Sig Start Date End Date Taking? Authorizing Provider  citalopram (CELEXA) 20 MG tablet Take 1 tablet (20 mg total) by mouth daily. 09/24/19   Estill Dooms, NP    Allergies    Patient has no known allergies.  Review of Systems   Review of Systems  Physical Exam Updated Vital Signs BP (!) 121/52 (BP Location: Right Arm)   Pulse 72   Temp 98.9 F (37.2 C) (Oral)   Resp 18    Ht 5\' 4"  (1.626 m)   Wt 106.1 kg   SpO2 96%   BMI 40.17 kg/m   Physical Exam        ED Results / Procedures / Treatments   Labs (all labs ordered are listed, but only abnormal results are displayed) Labs Reviewed  CBC WITH DIFFERENTIAL/PLATELET  BASIC METABOLIC PANEL  PROTIME-INR    EKG None  Radiology No results found.  Procedures Procedures (including critical care time)   Medications Ordered in ED Medications  Tdap (BOOSTRIX) injection 0.5 mL (has no administration in time range)  ceFAZolin (ANCEF) injection 2 g (has no administration in time range)  lidocaine-EPINEPHrine (XYLOCAINE W/EPI) 2 %-1:200000 (PF) injection 20 mL (has no administration in time range)  sterile water (preservative free) injection (has no administration in time range)  povidone-iodine (BETADINE) 10 % external solution (1 application  Given by Other 01/17/20 1325)  fentaNYL (SUBLIMAZE) injection 50 mcg (50 mcg  Intravenous Given 01/17/20 1323)    ED Course  I have reviewed the triage vital signs and the nursing notes.  Pertinent labs & imaging results that were available during my care of the patient were reviewed by me and considered in my medical decision making (see chart for details).    MDM Rules/Calculators/A&P                           Final Clinical Impression(s) / ED Diagnoses Final diagnoses:  None    Rx / DC Orders ED Discharge Orders    None       Derwood Kaplan, MD 01/17/20 1334

## 2020-01-17 NOTE — ED Triage Notes (Signed)
Pt reports cut tip of left ring finger off in doorway.  Pt has tip of finger in a cup of ice.

## 2020-01-18 ENCOUNTER — Encounter: Payer: Self-pay | Admitting: Orthopedic Surgery

## 2020-01-18 ENCOUNTER — Ambulatory Visit (INDEPENDENT_AMBULATORY_CARE_PROVIDER_SITE_OTHER): Payer: Worker's Compensation | Admitting: Orthopedic Surgery

## 2020-01-18 VITALS — BP 165/84 | HR 85 | Ht 64.0 in | Wt 230.0 lb

## 2020-01-18 DIAGNOSIS — S68119A Complete traumatic metacarpophalangeal amputation of unspecified finger, initial encounter: Secondary | ICD-10-CM

## 2020-01-18 NOTE — Progress Notes (Signed)
EMERGENCY ROOM FOLLOW UP  NEW PROBLEM/PATIENT   Patient ID: Christy Benton, female   DOB: 1971/05/25, 49 y.o.   MRN: [id number]  Emergency room record from (date) 01/17/20 has been reviewed and this is included by reference and includes the review of systems with the following addition:   Chief Complaint  Patient presents with  . Finger Injury    left ring finger tip amputation 01/17/20 caught in door at work     HPI Christy Benton is a 49 y.o. female.  LHD LEFT RING FINGER AMP PARTIAL   49 YO W/C DOOR VS FINGERTIP; PARTIAL AMP   VOLAR OBLIQUE   DISTAL PHALANX IS FRACTURED   ER INITIAL EVAL   BUSINESS OFF COORDINATOR SCHEDULED TO SWITCH TO TRANSPORTATION SOON    Review of Systems Review of Systems  All other systems reviewed and are negative.    has a past medical history of Rectocele (06/07/2015) and Stress incontinence in female (06/07/2015).   Past Surgical History:  Procedure Laterality Date  . ABDOMINAL HYSTERECTOMY  2010  . CHOLECYSTECTOMY  1992  . TUBAL LIGATION  2004    Family History  Problem Relation Age of Onset  . Hypertension Mother   . Arrhythmia Father   . Arrhythmia Maternal Grandmother        had pacemaker  . Stroke Maternal Grandfather        mini strokes  . Alzheimer's disease Paternal Grandmother   . Other Paternal Grandfather        due to MVA    Social History Social History   Tobacco Use  . Smoking status: Never Smoker  . Smokeless tobacco: Never Used  Vaping Use  . Vaping Use: Never used  Substance Use Topics  . Alcohol use: Yes    Comment: rarely  . Drug use: No    No Known Allergies  Current Outpatient Medications  Medication Sig Dispense Refill  . cephALEXin (KEFLEX) 500 MG capsule Take 1 capsule (500 mg total) by mouth 4 (four) times daily for 7 days. 28 capsule 0  . citalopram (CELEXA) 20 MG tablet Take 1 tablet (20 mg total) by mouth daily. 90 tablet 2  . doxycycline (VIBRAMYCIN) 100 MG capsule Take 1 capsule (100 mg  total) by mouth 2 (two) times daily. 14 capsule 0  . HYDROcodone-acetaminophen (NORCO/VICODIN) 5-325 MG tablet Take 2 tablets by mouth every 4 (four) hours as needed. 6 tablet 0  . Melatonin 10 MG CAPS Take 1 capsule by mouth at bedtime.     No current facility-administered medications for this visit.    Physical Exam BP (!) 165/84   Pulse 85   Ht 5\' 4"  (1.626 m)   Wt 230 lb (104.3 kg)   BMI 39.48 kg/m  Body mass index is 39.48 kg/m.  Well developed and well nourished  Stands with normal weight bearing line  Alert and oriented x 3  Normal affect and mood  Ortho Exam LEFT RING FINGER   SEE MEDIA FOR PICTURES   Sensations intact PIP joint is mobile PIP joint flexor extensor tendons are normal.  She can bend the tip a little bit as well I think the motion there is diminished by pain color and capillary refill look normal  Data Reviewed IMAGING From THE ER AND THE REPORT ARE REVIEWED, MY INTERPRETATION OF THE IMAGE(S) IS :  COMMINUTED FRACTURE DISTAL PHALANX LEFT RING FINGER   PARTIAL AMPUTATION  Assessment  VOLAR OBLIQUE FINGER TIP AMP LEFT RING FINGER, simple  transverse amputation requiring rongeuring and wound closure versus granulation would be something I had be comfortable doing but I do not do the finger flaps or the volar advancements.  Therefore I am sending her to a hand surgeon  She is advised to call us at any time she is having trouble before seeing the hand surgeon   Plan   DRESSING   CONSULT HAND   WORK OK WITH NO LIFTING    Fuller Canada, MD 01/18/2020 2:21 PM

## 2020-01-18 NOTE — Patient Instructions (Signed)
WORK OK IN THE OFFICE BUT NO LIFTING WITH THE LEFT HAND

## 2020-01-18 NOTE — Telephone Encounter (Signed)
Patient aware of appointment scheduled for today 01/18/20.

## 2020-06-27 ENCOUNTER — Other Ambulatory Visit: Payer: Self-pay

## 2020-06-27 ENCOUNTER — Encounter: Payer: Self-pay | Admitting: Adult Health

## 2020-06-27 ENCOUNTER — Ambulatory Visit (INDEPENDENT_AMBULATORY_CARE_PROVIDER_SITE_OTHER): Payer: BC Managed Care – PPO | Admitting: Adult Health

## 2020-06-27 VITALS — BP 137/79 | HR 77 | Ht 64.0 in | Wt 234.0 lb

## 2020-06-27 DIAGNOSIS — R2689 Other abnormalities of gait and mobility: Secondary | ICD-10-CM | POA: Insufficient documentation

## 2020-06-27 DIAGNOSIS — Z01419 Encounter for gynecological examination (general) (routine) without abnormal findings: Secondary | ICD-10-CM

## 2020-06-27 DIAGNOSIS — R202 Paresthesia of skin: Secondary | ICD-10-CM

## 2020-06-27 DIAGNOSIS — Z1321 Encounter for screening for nutritional disorder: Secondary | ICD-10-CM

## 2020-06-27 DIAGNOSIS — Z1211 Encounter for screening for malignant neoplasm of colon: Secondary | ICD-10-CM | POA: Diagnosis not present

## 2020-06-27 DIAGNOSIS — Z131 Encounter for screening for diabetes mellitus: Secondary | ICD-10-CM

## 2020-06-27 DIAGNOSIS — R200 Anesthesia of skin: Secondary | ICD-10-CM | POA: Diagnosis not present

## 2020-06-27 DIAGNOSIS — R52 Pain, unspecified: Secondary | ICD-10-CM | POA: Insufficient documentation

## 2020-06-27 DIAGNOSIS — F439 Reaction to severe stress, unspecified: Secondary | ICD-10-CM

## 2020-06-27 DIAGNOSIS — F419 Anxiety disorder, unspecified: Secondary | ICD-10-CM

## 2020-06-27 LAB — HEMOCCULT GUIAC POC 1CARD (OFFICE): Fecal Occult Blood, POC: NEGATIVE

## 2020-06-27 MED ORDER — CITALOPRAM HYDROBROMIDE 20 MG PO TABS: 20.0000 mg | ORAL_TABLET | Freq: Every day | ORAL | 3 refills | Status: AC

## 2020-06-27 NOTE — Progress Notes (Signed)
Patient ID: Christy Benton, female   DOB: [birth year], 49 y.o.   MRN: [id number] History of Present Illness: Christy Benton is a 49 year old white female,married, sp hysterectomy in for a well woman gyn exam and she complains  of hands and feet tingle, and she has loss of balance at times and body aches, esp hips and knees and will mix her words up. She had amputation left 4th finger, a door closed on it and she quit her job and is stressed.  She says her mom and dad had neuropathy.  PCP is Dr Elvina Mattes.   Current Medications, Allergies, Past Medical History, Past Surgical History, Family History and Social History were reviewed in [employer] record.     Review of Systems:  Patient denies any headaches, hearing loss, fatigue, blurred vision, shortness of breath, chest pain, abdominal pain, problems with bowel movements, urination, or intercourse. No mood swings. See HPI for positives.    Physical Exam:BP 137/79 (BP Location: Left Arm, Patient Position: Sitting, Cuff Size: Large)   Pulse 77   Ht '5\' 4"'  (1.626 m)   Wt 234 lb (106.1 kg)   BMI 40.17 kg/m  General:  Well developed, well nourished, no acute distress Skin:  Warm and dry Neck:  Midline trachea, normal thyroid, good ROM, no lymphadenopathy CN 2-12 intact Lungs; Clear to auscultation bilaterally Breast:  No dominant palpable mass, retraction, or nipple discharge Cardiovascular: Regular rate and rhythm Abdomen:  Soft, non tender, no hepatosplenomegaly Pelvic:  External genitalia is normal in appearance, no lesions.  The vagina is normal in appearance. Urethra has no lesions or masses. The cervix and uterus are absent.  No adnexal masses or tenderness noted.Bladder is non tender, no masses felt. Rectal: Good sphincter tone, no polyps, or hemorrhoids felt.  Hemoccult negative. Extremities/musculoskeletal:  No swelling or varicosities noted, no clubbing or cyanosis Psych:  No mood changes, alert and cooperative,seems  happy AA is 1 Fall risk is high PHQ 9 score is 7, no SI on celexa  Upstream - 06/27/20 1130      Pregnancy Intention Screening   Does the patient want to become pregnant in the next year? No    Does the patient's partner want to become pregnant in the next year? No    Would the patient like to discuss contraceptive options today? No      Contraception Wrap Up   Current Method Female Sterilization   hysterectomy   End Method Female Sterilization    Contraception Counseling Provided No         Examination chaperoned by Glenard Haring RN  Impression and Plan: 1. Encounter for well woman exam with routine gynecological exam Physical in 1 year Mammogram yearly Check CBC,CMP,TSH and lipids  2. Encounter for screening fecal occult blood testing   3. Tingling of both feet Will refer to [facility]  4. Numbness and tingling in both hands Will refer to [facility]  5. Loss of balance Will refer to [facility]  6. Anxiety Continue celexa Meds ordered this encounter  Medications  . citalopram (CELEXA) 20 MG tablet    Sig: Take 1 tablet (20 mg total) by mouth daily.    Dispense:  90 tablet    Refill:  3    Order Specific Question:   Supervising Provider    Answer:   Elonda Husky, LUTHER H [[id number]]    7. Stress  8. Body aches Check ANA and ESR  9. Encounter for vitamin deficiency screening Check vitamin D  10.  Screening for diabetes mellitus Check A1c

## 2020-06-28 LAB — LIPID PANEL
Chol/HDL Ratio: 2.9 ratio (ref 0.0–4.4)
Cholesterol, Total: 175 mg/dL (ref 100–199)
HDL: 61 mg/dL (ref >39)
LDL Chol Calc (NIH): 102 mg/dL — ABNORMAL HIGH (ref 0–99)
Triglycerides: 65 mg/dL (ref 0–149)
VLDL Cholesterol Cal: 12 mg/dL (ref 5–40)

## 2020-06-28 LAB — COMPREHENSIVE METABOLIC PANEL
ALT: 19 IU/L (ref 0–32)
AST: 11 IU/L (ref 0–40)
Albumin/Globulin Ratio: 1.5 (ref 1.2–2.2)
Albumin: 4.5 g/dL (ref 3.8–4.9)
Alkaline Phosphatase: 87 IU/L (ref 44–121)
BUN/Creatinine Ratio: 10 (ref 9–23)
BUN: 8 mg/dL (ref 6–24)
Bilirubin Total: 0.4 mg/dL (ref 0.0–1.2)
CO2: 26 mmol/L (ref 20–29)
Calcium: 9.5 mg/dL (ref 8.7–10.2)
Chloride: 103 mmol/L (ref 96–106)
Creatinine, Ser: 0.8 mg/dL (ref 0.57–1.00)
GFR calc Af Amer: 99 mL/min/{1.73_m2} (ref >59)
GFR calc non Af Amer: 86 mL/min/{1.73_m2} (ref >59)
Globulin, Total: 3 g/dL (ref 1.5–4.5)
Glucose: 88 mg/dL (ref 65–99)
Potassium: 4.6 mmol/L (ref 3.5–5.2)
Sodium: 141 mmol/L (ref 134–144)
Total Protein: 7.5 g/dL (ref 6.0–8.5)

## 2020-06-28 LAB — CBC
Hematocrit: 39.9 % (ref 34.0–46.6)
Hemoglobin: 13.4 g/dL (ref 11.1–15.9)
MCH: 30.8 pg (ref 26.6–33.0)
MCHC: 33.6 g/dL (ref 31.5–35.7)
MCV: 92 fL (ref 79–97)
Platelets: 313 10*3/uL (ref 150–450)
RBC: 4.35 x10E6/uL (ref 3.77–5.28)
RDW: 12.3 % (ref 11.7–15.4)
WBC: 7.4 10*3/uL (ref 3.4–10.8)

## 2020-06-28 LAB — TSH: TSH: 1.82 u[IU]/mL (ref 0.450–4.500)

## 2020-06-28 LAB — HEMOGLOBIN A1C
Est. average glucose Bld gHb Est-mCnc: 97 mg/dL
Hgb A1c MFr Bld: 5 % (ref 4.8–5.6)

## 2020-06-28 LAB — VITAMIN D 25 HYDROXY (VIT D DEFICIENCY, FRACTURES): Vit D, 25-Hydroxy: 15.4 ng/mL — ABNORMAL LOW (ref 30.0–100.0)

## 2020-06-28 LAB — ANA: Anti Nuclear Antibody (ANA): NEGATIVE

## 2020-06-28 LAB — SEDIMENTATION RATE: Sed Rate: 27 mm/hr (ref 0–40)

## 2020-07-20 ENCOUNTER — Ambulatory Visit: Payer: Self-pay | Admitting: Orthopaedic Surgery

## 2020-08-15 ENCOUNTER — Ambulatory Visit: Payer: Self-pay | Admitting: Orthopaedic Surgery

## 2020-09-11 ENCOUNTER — Telehealth: Payer: Self-pay | Admitting: Neurology

## 2020-09-11 NOTE — Progress Notes (Deleted)
[facility]    Provider:  Dr Lucia Gaskins Requesting Provider: Adline Potter, NP Primary Care Provider:  Joaquin Courts, DO  CC:  ***  HPI:  Christy Benton is a 50 y.o. female here as requested by Adline Potter, NP for tingling in both feet and hands.  I reviewed [facility] notes: Patient was seen June 27, 2020 complaining of hands and feet tingling, loss of balance at times and body aches, especially hips and knees and will mix her words up, she had amputation of the left fourth finger a door closed on it and she quit her job and is stressed, she says her mom and dad had neuropathy.  I reviewed [facility] notes, her examination was normal including lungs, cardiovascular, abdomen, extremities, skin, cranial nerves.  I reviewed labs taken which showed very low vitamin D, hemoglobin A1c, CBC, CMP, ANA, TSH and sed rate were normal  Reviewed notes, labs and imaging from outside physicians, which showed ***  Review of Systems: Patient complains of symptoms per HPI as well as the following symptoms ***. Pertinent negatives and positives per HPI. All others negative.   Social History   Socioeconomic History  . Marital status: Married    Spouse name: Not on file  . Number of children: 4  . Years of education: Not on file  . Highest education level: Not on file  Occupational History  . Not on file  Tobacco Use  . Smoking status: Never Smoker  . Smokeless tobacco: Never Used  Vaping Use  . Vaping Use: Never used  Substance and Sexual Activity  . Alcohol use: Yes    Comment: rarely  . Drug use: No  . Sexual activity: Yes    Birth control/protection: Surgical    Comment: hyst  Other Topics Concern  . Not on file  Social History Narrative  . Not on file   Social Determinants of Health   Financial Resource Strain: Low Risk   . Difficulty of Paying Living Expenses: Not hard at all  Food Insecurity: No Food Insecurity  .  Worried About Programme researcher, broadcasting/film/video in the Last Year: Never true  . Ran Out of Food in the Last Year: Never true  Transportation Needs: No Transportation Needs  . Lack of Transportation (Medical): No  . Lack of Transportation (Non-Medical): No  Physical Activity: Inactive  . Days of Exercise per Week: 0 days  . Minutes of Exercise per Session: 0 min  Stress: Stress Concern Present  . Feeling of Stress : To some extent  Social Connections: Socially Integrated  . Frequency of Communication with Friends and Family: More than three times a week  . Frequency of Social Gatherings with Friends and Family: More than three times a week  . Attends Religious Services: More than 4 times per year  . Active Member of Clubs or Organizations: Yes  . Attends Banker Meetings: More than 4 times per year  . Marital Status: Married  Catering manager Violence: Not At Risk  . Fear of Current or Ex-Partner: No  . Emotionally Abused: No  . Physically Abused: No  . Sexually Abused: No    Family History  Problem Relation Age of Onset  . Hypertension Mother   . Arrhythmia Father   . Arrhythmia Maternal Grandmother        had pacemaker  . Stroke Maternal Grandfather        mini strokes  . Alzheimer's disease Paternal Grandmother   .  Other Paternal Grandfather        due to MVA    Past Medical History:  Diagnosis Date  . Rectocele 06/07/2015  . Stress incontinence in female 06/07/2015    Patient Active Problem List   Diagnosis Date Noted  . Encounter for well woman exam with routine gynecological exam 06/27/2020  . Encounter for screening fecal occult blood testing 06/27/2020  . Loss of balance 06/27/2020  . Numbness and tingling in both hands 06/27/2020  . Tingling of both feet 06/27/2020  . Stress 06/27/2020  . Body aches 06/27/2020  . Sleep disturbance 06/16/2019  . Hot flashes 06/16/2019  . Stress at home 06/16/2019  . Anxiety 06/16/2019  . Encounter for well woman exam  01/13/2018  . Kidney stone 10/04/2016  . Stress incontinence in female 06/07/2015  . Rectocele 06/07/2015  . Palpitations 09/30/2003  . Right bundle branch block 09/30/2003  . Obesity 08/26/2003  . Weight gain 08/26/2003    Past Surgical History:  Procedure Laterality Date  . ABDOMINAL HYSTERECTOMY  2010  . CHOLECYSTECTOMY  1992  . FINGER AMPUTATION Left 01/17/2020   Left ring finger  . TUBAL LIGATION  2004    Current Outpatient Medications  Medication Sig Dispense Refill  . citalopram (CELEXA) 20 MG tablet Take 1 tablet (20 mg total) by mouth daily. 90 tablet 3  . Melatonin 10 MG CAPS Take 1 capsule by mouth at bedtime.     No current facility-administered medications for this visit.    Allergies as of 09/12/2020  . (No Known Allergies)    Vitals: There were no vitals taken for this visit. Last Weight:  Wt Readings from Last 1 Encounters:  06/27/20 234 lb (106.1 kg)   Last Height:   Ht Readings from Last 1 Encounters:  06/27/20 5\' 4"  (1.626 m)     Physical exam: Exam: Gen: NAD, conversant, well nourised, obese, well groomed                     CV: RRR, no MRG. No Carotid Bruits. No peripheral edema, warm, nontender Eyes: Conjunctivae clear without exudates or hemorrhage  Neuro: Detailed Neurologic Exam  Speech:    Speech is normal; fluent and spontaneous with normal comprehension.  Cognition:    The patient is oriented to person, place, and time;     recent and remote memory intact;     language fluent;     normal attention, concentration,     fund of knowledge Cranial Nerves:    The pupils are equal, round, and reactive to light. The fundi are normal and spontaneous venous pulsations are present. Visual fields are full to finger confrontation. Extraocular movements are intact. Trigeminal sensation is intact and the muscles of mastication are normal. The face is symmetric. The palate elevates in the midline. Hearing intact. Voice is normal. Shoulder shrug  is normal. The tongue has normal motion without fasciculations.   Coordination:    Normal finger to nose and heel to shin. Normal rapid alternating movements.   Gait:    Heel-toe and tandem gait are normal.   Motor Observation:    No asymmetry, no atrophy, and no involuntary movements noted. Tone:    Normal muscle tone.    Posture:    Posture is normal. normal erect    Strength:    Strength is V/V in the upper and lower limbs.      Sensation: intact to LT     Reflex Exam:  DTR's:  Deep tendon reflexes in the upper and lower extremities are normal bilaterally.   Toes:    The toes are downgoing bilaterally.   Clonus:    Clonus is absent.    Assessment/Plan:    No orders of the defined types were placed in this encounter.  No orders of the defined types were placed in this encounter.   Cc: Adline Potter, NP,  Joaquin Courts, DO  Naomie Dean, MD  [facility] [address] [place], [place] [address]  Phone [phone] Fax [phone]

## 2020-09-11 NOTE — Telephone Encounter (Signed)
@   11:30 pt left vm asking for a call back to r/s her appointment.  On pt's vm she asked the only person to leave a voicemail on her voicemail be her father.  Phone rep did make an attempt to call pt to r/s.  No message left asking pt to call per the request pt has on her voicemail message.  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

## 2020-09-12 ENCOUNTER — Ambulatory Visit: Payer: BC Managed Care – PPO | Admitting: Neurology

## 2020-12-18 IMAGING — DX DG FINGER RING 2+V*L*
3 series · 3 of 3 positions shown · non-contrast
Comparison: None.

CLINICAL DATA: Closed finger in door way

EXAM:
LEFT RING FINGER 2+V

[finger ap]
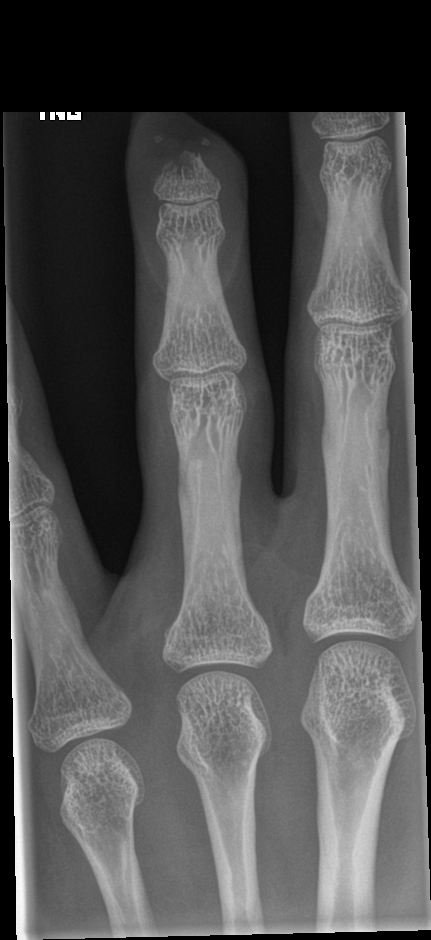

[finger obl]
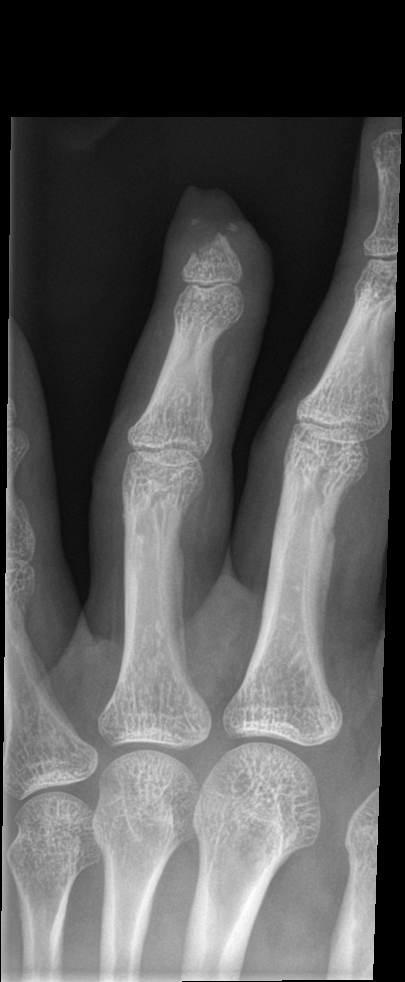

[finger lat]
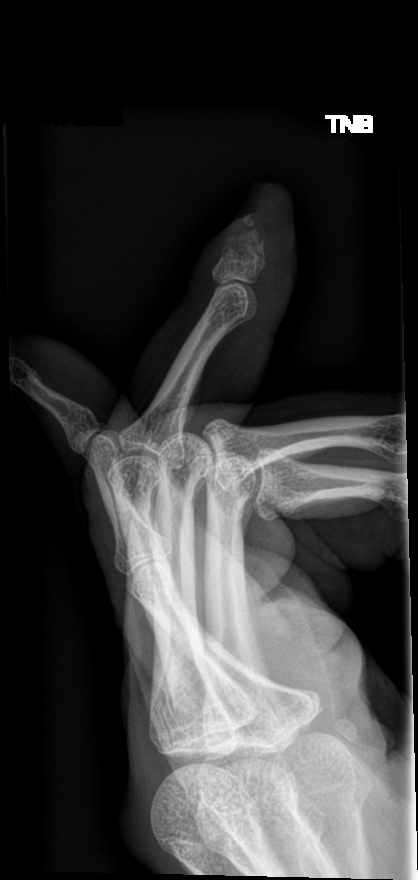

[3 of 3 positions shown; findings below may reference images not displayed]

FINDINGS: Amputation of the third distal phalanx and associated soft tissues.
Small bone fragments in the soft tissues.

No arthropathy.
IMPRESSION: Traumatic amputation of the distal half of the distal third phalanx
and associated soft tissues.

## 2022-10-03 ENCOUNTER — Encounter: Payer: Self-pay | Admitting: Radiology
